# Patient Record
Sex: Male | Born: 1985 | Race: White | Hispanic: No | Marital: Single | Smoking: Never smoker
Health system: Southern US, Community
[De-identification: ages and names within clinical notes are randomized; demographics above are authoritative.]

---

## 2009-07-09 LAB — BASIC METABOLIC PANEL
BUN: 12 mg/dL (ref 4–21)
Creatinine: 1.1 mg/dL (ref 0.6–1.3)
Glucose: 109 mg/dL
Potassium: 4.7 mmol/L (ref 3.4–5.3)
Sodium: 139 mmol/L (ref 137–147)

## 2009-07-09 LAB — LIPID PANEL
Cholesterol: 204 mg/dL — AB (ref 0–200)
HDL: 35 mg/dL (ref 35–70)
LDL CALC: 144 mg/dL
LDL/HDL RATIO: 4.1
TRIGLYCERIDES: 126 mg/dL (ref 40–160)

## 2009-07-09 LAB — HEPATIC FUNCTION PANEL
ALT: 44 U/L — AB (ref 10–40)
AST: 25 U/L (ref 14–40)
Alkaline Phosphatase: 53 U/L (ref 25–125)
Bilirubin, Total: 0.6 mg/dL

## 2013-01-10 ENCOUNTER — Ambulatory Visit: Payer: Self-pay | Admitting: Family Medicine

## 2013-11-18 ENCOUNTER — Ambulatory Visit (INDEPENDENT_AMBULATORY_CARE_PROVIDER_SITE_OTHER): Payer: BC Managed Care – PPO | Admitting: Podiatry

## 2013-11-18 ENCOUNTER — Encounter: Payer: Self-pay | Admitting: Podiatry

## 2013-11-18 ENCOUNTER — Ambulatory Visit (INDEPENDENT_AMBULATORY_CARE_PROVIDER_SITE_OTHER): Payer: BC Managed Care – PPO

## 2013-11-18 VITALS — BP 139/86 | HR 78 | Resp 16 | Ht 72.0 in | Wt 220.0 lb

## 2013-11-18 DIAGNOSIS — L600 Ingrowing nail: Secondary | ICD-10-CM

## 2013-11-18 DIAGNOSIS — M2140 Flat foot [pes planus] (acquired), unspecified foot: Secondary | ICD-10-CM

## 2013-11-18 DIAGNOSIS — M7750 Other enthesopathy of unspecified foot: Secondary | ICD-10-CM

## 2013-11-18 NOTE — Progress Notes (Signed)
   Subjective:    Patient ID: Jeffery Brown, male    DOB: June 11, 1985, 28 y.o.   MRN: [id number]  HPI Comments: i have an ingrown toenail on my rt great toe. Its been hurting for 2 - 3 months. Its about the same. Touching it and walking hurts. i have cleaned it and i trim my toenails.   i have flat feet. They have been like this all my life. They are getting worse. Ive always had soreness in them. Walking and standing over time will bother me. i have tried otc inserts and different shoes.      Review of Systems  All other systems reviewed and are negative.      Objective:   Physical Exam        Assessment & Plan:

## 2013-11-18 NOTE — Progress Notes (Signed)
Subjective:     Patient ID: Jeffery Brown, male   DOB: 11/13/1985, 28 y.o.   MRN: [id number]  HPI patient states I have a painful ingrown toenail on my right big toe that's been present for about 3 months and it's just is irritated and I have not been able to get out myself through soaks or trying to trim it. Also my feet are very flat and I do a lot of pain in my feet and lower back pain in that's been going on for years   Review of Systems  All other systems reviewed and are negative.      Objective:   Physical Exam  Nursing note and vitals reviewed. Constitutional: He is oriented to person, place, and time.  Cardiovascular: Intact distal pulses.   Musculoskeletal: Normal range of motion.  Neurological: He is oriented to person, place, and time.  Skin: Skin is warm.   neurovascular status intact with muscle strength adequate and range of motion of the subtalar midtarsal joint within normal limits. 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 Today recommended correction of ingrown toenail and explained procedure and risk patient wants surgery and today I infiltrated the right hallux 60 mg Xylocaine Marcaine mixture removed the medial border exposed matrix and applied phenol 3 applications 30 seconds followed by alcohol lavaged and sterile dressing. Gave instructions on soaks I also scanned for custom orthotics do to long-term structural changes in his feet and pain associated with the deformity

## 2013-11-18 NOTE — Patient Instructions (Addendum)

## 2013-12-09 ENCOUNTER — Ambulatory Visit (INDEPENDENT_AMBULATORY_CARE_PROVIDER_SITE_OTHER): Payer: BC Managed Care – PPO | Admitting: *Deleted

## 2013-12-09 DIAGNOSIS — M2140 Flat foot [pes planus] (acquired), unspecified foot: Secondary | ICD-10-CM

## 2013-12-09 NOTE — Progress Notes (Signed)
Pt presents for orthotic pick up ,written and verbal instructions are given  

## 2013-12-09 NOTE — Patient Instructions (Signed)

## 2014-09-15 IMAGING — CR RIGHT FOOT COMPLETE - 3+ VIEW
1 series · 3 of 3 positions shown · non-contrast
Comparison: none

REASON FOR EXAM: injury and swelling fax results [phone] Dr Luisito Montez
COMMENTS:

[Series 1: x foot ap right · 0.14mm/px · 3 of 3 slices shown]
[im 1/3]
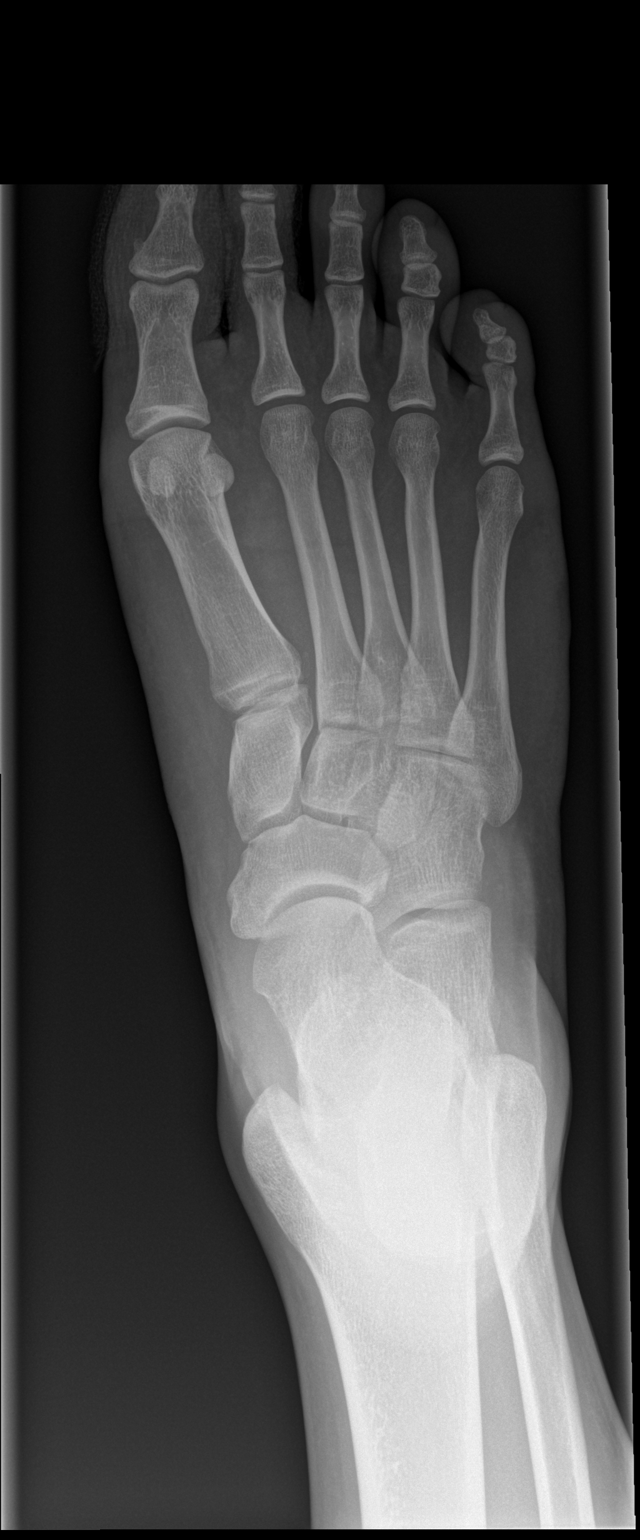
[im 2/3]
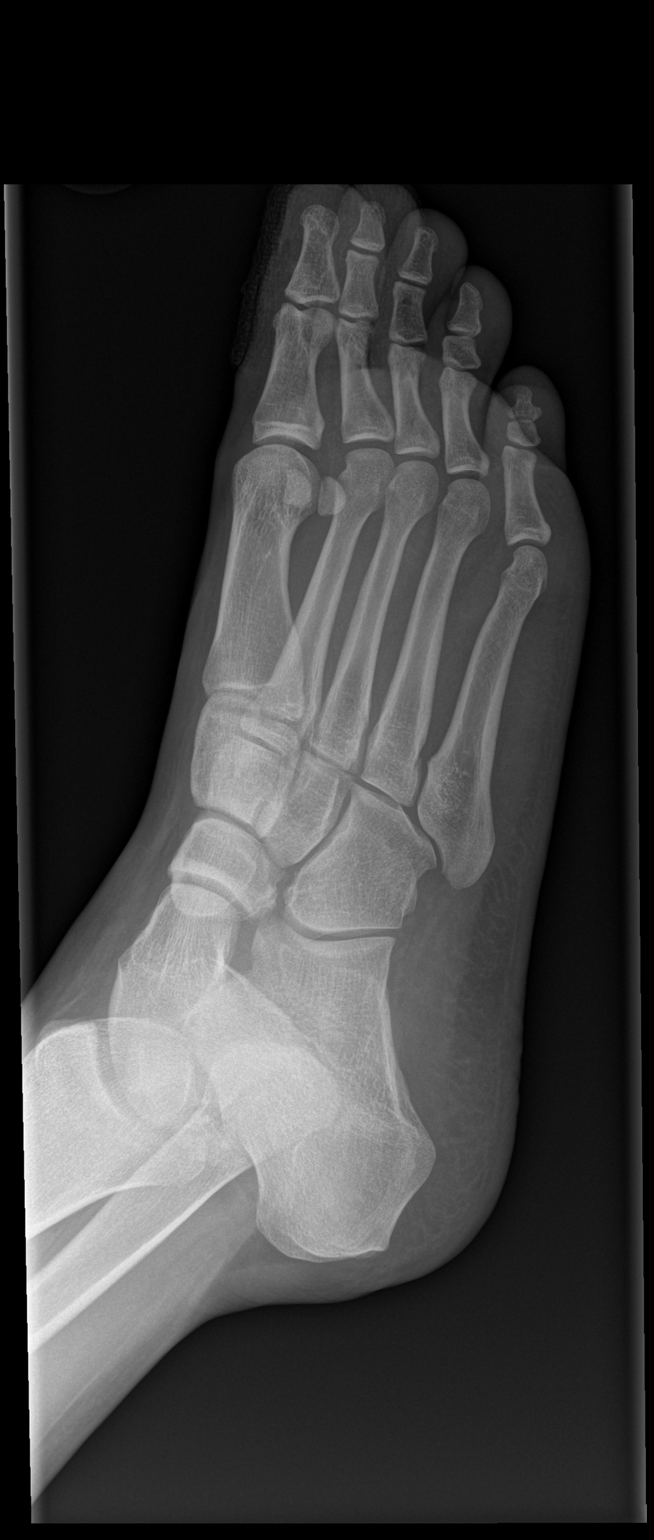
[im 3/3]
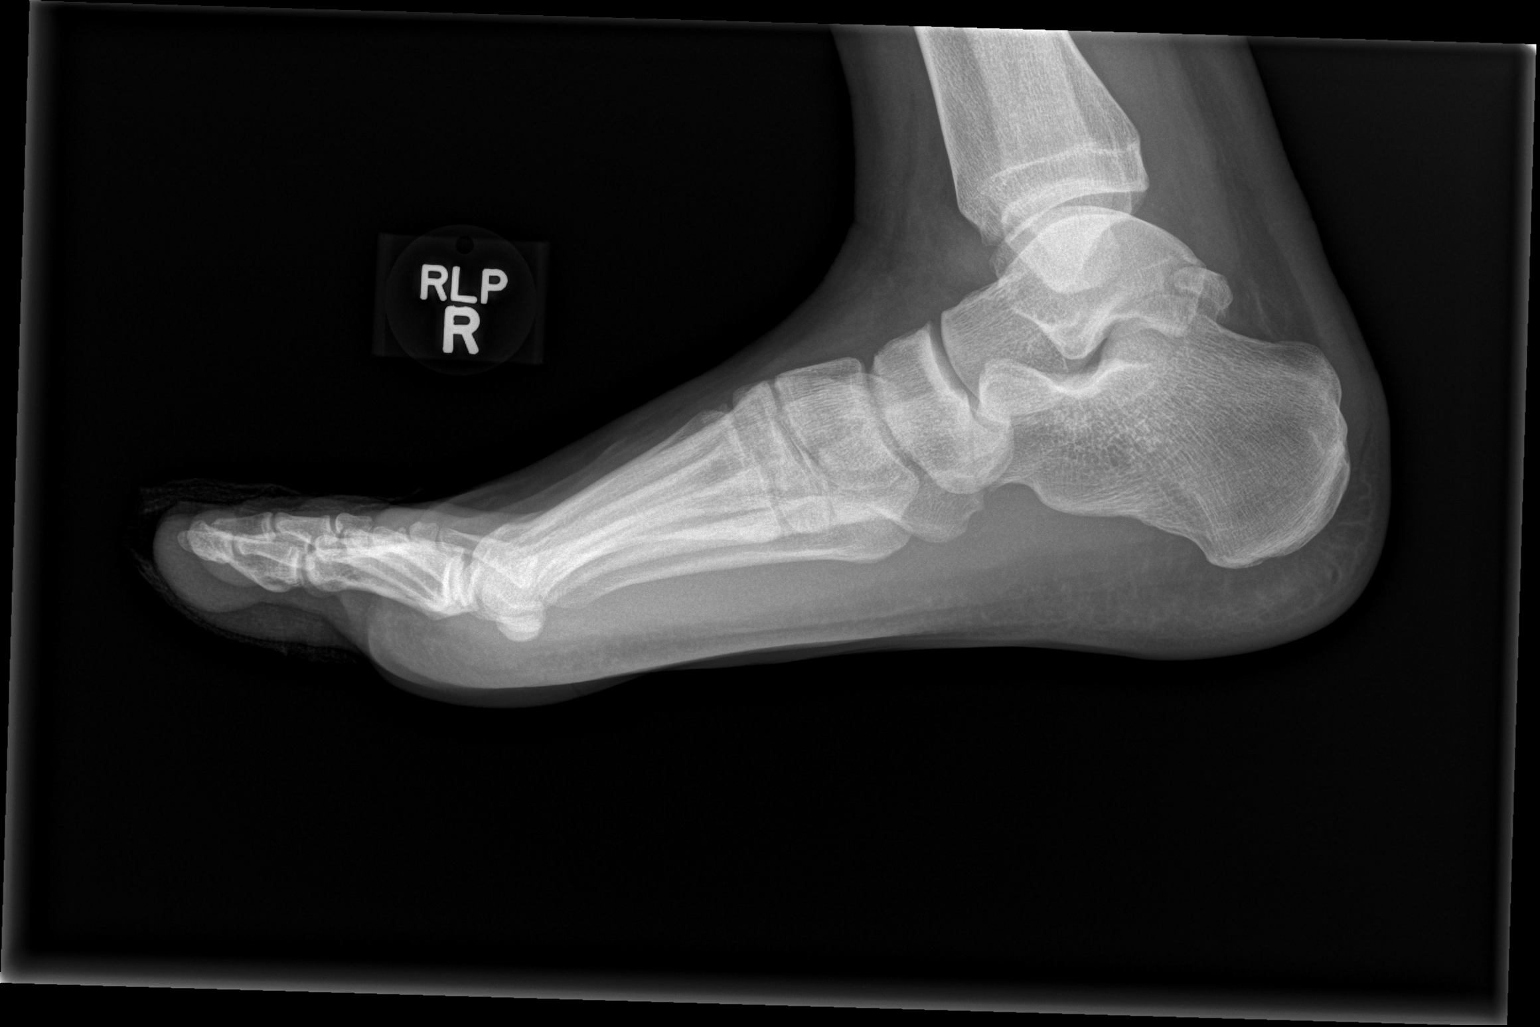

[3 of 3 positions shown; findings below may reference images not displayed]

PROCEDURE:     DXR - DXR FOOT RT COMPLETE W/OBLIQUES  - January 10, 2013 [DATE]

RESULT:     Three views of the right foot reveals the bones to be adequately
mineralized. There is no evidence of an acute fracture nor dislocation. The
interphalangeal joints and the metatarsophalangeal joints and the
tarsometatarsal joints are normal in appearance. The bones of the hindfoot
appear normal.
IMPRESSION: There is no acute bony abnormality of the right foot.
Specific attention to the first and second digits reveals no acute
abnormality.

[REDACTED]

## 2015-10-11 DIAGNOSIS — J309 Allergic rhinitis, unspecified: Secondary | ICD-10-CM | POA: Insufficient documentation
